# Patient Record
Sex: Female | Born: 1990 | Race: Black or African American | Hispanic: No | Marital: Single | Smoking: Current every day smoker
Health system: Southern US, Community
[De-identification: ages and names within clinical notes are randomized; demographics above are authoritative.]

## PROBLEM LIST (undated history)

## (undated) DIAGNOSIS — E079 Disorder of thyroid, unspecified: Secondary | ICD-10-CM

## (undated) DIAGNOSIS — Z789 Other specified health status: Secondary | ICD-10-CM

## (undated) HISTORY — PX: NO PAST SURGERIES: SHX2092

---

## 2017-06-25 ENCOUNTER — Encounter (HOSPITAL_COMMUNITY): Payer: Self-pay | Admitting: Emergency Medicine

## 2017-06-25 DIAGNOSIS — F17290 Nicotine dependence, other tobacco product, uncomplicated: Secondary | ICD-10-CM | POA: Diagnosis not present

## 2017-06-25 DIAGNOSIS — K2970 Gastritis, unspecified, without bleeding: Secondary | ICD-10-CM | POA: Insufficient documentation

## 2017-06-25 DIAGNOSIS — N760 Acute vaginitis: Secondary | ICD-10-CM | POA: Diagnosis not present

## 2017-06-25 DIAGNOSIS — B9689 Other specified bacterial agents as the cause of diseases classified elsewhere: Secondary | ICD-10-CM | POA: Diagnosis not present

## 2017-06-25 DIAGNOSIS — Z113 Encounter for screening for infections with a predominantly sexual mode of transmission: Secondary | ICD-10-CM | POA: Insufficient documentation

## 2017-06-25 DIAGNOSIS — R197 Diarrhea, unspecified: Secondary | ICD-10-CM | POA: Insufficient documentation

## 2017-06-25 DIAGNOSIS — R110 Nausea: Secondary | ICD-10-CM | POA: Insufficient documentation

## 2017-06-25 DIAGNOSIS — R102 Pelvic and perineal pain: Secondary | ICD-10-CM | POA: Diagnosis present

## 2017-06-25 LAB — URINALYSIS, ROUTINE W REFLEX MICROSCOPIC
Bilirubin Urine: NEGATIVE
Glucose, UA: NEGATIVE mg/dL
Hgb urine dipstick: NEGATIVE
Ketones, ur: 5 mg/dL — AB
Nitrite: NEGATIVE
Protein, ur: NEGATIVE mg/dL
Specific Gravity, Urine: 1.028 (ref 1.005–1.030)
pH: 6 (ref 5.0–8.0)

## 2017-06-25 LAB — CBC
HCT: 37 % (ref 36.0–46.0)
Hemoglobin: 12.2 g/dL (ref 12.0–15.0)
MCH: 30.4 pg (ref 26.0–34.0)
MCHC: 33 g/dL (ref 30.0–36.0)
MCV: 92.3 fL (ref 78.0–100.0)
Platelets: 198 10*3/uL (ref 150–400)
RBC: 4.01 MIL/uL (ref 3.87–5.11)
RDW: 12.6 % (ref 11.5–15.5)
WBC: 8.6 10*3/uL (ref 4.0–10.5)

## 2017-06-25 LAB — COMPREHENSIVE METABOLIC PANEL
ALT: 16 U/L (ref 14–54)
AST: 21 U/L (ref 15–41)
Albumin: 3.7 g/dL (ref 3.5–5.0)
Alkaline Phosphatase: 43 U/L (ref 38–126)
Anion gap: 6 (ref 5–15)
BUN: 6 mg/dL (ref 6–20)
CO2: 26 mmol/L (ref 22–32)
Calcium: 8.7 mg/dL — ABNORMAL LOW (ref 8.9–10.3)
Chloride: 110 mmol/L (ref 101–111)
Creatinine, Ser: 0.68 mg/dL (ref 0.44–1.00)
GFR calc Af Amer: >60 mL/min (ref >60)
GFR calc non Af Amer: >60 mL/min (ref >60)
Glucose, Bld: 71 mg/dL (ref 65–99)
Potassium: 3.6 mmol/L (ref 3.5–5.1)
Sodium: 142 mmol/L (ref 135–145)
Total Bilirubin: 1 mg/dL (ref 0.3–1.2)
Total Protein: 6.1 g/dL — ABNORMAL LOW (ref 6.5–8.1)

## 2017-06-25 LAB — I-STAT BETA HCG BLOOD, ED (MC, WL, AP ONLY): I-stat hCG, quantitative: <5 m[IU]/mL (ref <5)

## 2017-06-25 LAB — LIPASE, BLOOD: Lipase: 22 U/L (ref 11–51)

## 2017-06-25 NOTE — ED Triage Notes (Signed)
States I think I have the stomach bug I have had diarrhea for about a week.  Denies any n/v.  Also requests STD check.  Is also reporting bil flank pain and abdominal aching.

## 2017-06-26 ENCOUNTER — Emergency Department (HOSPITAL_COMMUNITY)
Admission: EM | Admit: 2017-06-26 | Discharge: 2017-06-26 | Disposition: A | Discharge status: Home / Self Care | Payer: Medicaid Other | Attending: Physician Assistant | Admitting: Physician Assistant

## 2017-06-26 DIAGNOSIS — B9689 Other specified bacterial agents as the cause of diseases classified elsewhere: Secondary | ICD-10-CM

## 2017-06-26 DIAGNOSIS — N760 Acute vaginitis: Secondary | ICD-10-CM

## 2017-06-26 DIAGNOSIS — K2970 Gastritis, unspecified, without bleeding: Secondary | ICD-10-CM

## 2017-06-26 HISTORY — DX: Disorder of thyroid, unspecified: E07.9

## 2017-06-26 LAB — RPR: RPR Ser Ql: NONREACTIVE

## 2017-06-26 LAB — WET PREP, GENITAL
Sperm: NONE SEEN
Trich, Wet Prep: NONE SEEN
Yeast Wet Prep HPF POC: NONE SEEN

## 2017-06-26 LAB — HIV ANTIBODY (ROUTINE TESTING W REFLEX): HIV Screen 4th Generation wRfx: NONREACTIVE

## 2017-06-26 MED ORDER — METRONIDAZOLE 500 MG PO TABS
500.0000 mg | ORAL_TABLET | Freq: Once | ORAL | Status: AC
  Administered 2017-06-26: 500 mg via ORAL
  Filled 2017-06-26: qty 1

## 2017-06-26 MED ORDER — METRONIDAZOLE 500 MG PO TABS
500.0000 mg | ORAL_TABLET | Freq: Two times a day (BID) | ORAL | 0 refills | Status: DC
Start: 2017-06-26 — End: 2019-11-22

## 2017-06-26 MED ORDER — FLUCONAZOLE 200 MG PO TABS: 200.0000 mg | ORAL_TABLET | Freq: Every day | ORAL | 0 refills | Status: AC

## 2017-06-26 MED ORDER — OMEPRAZOLE 20 MG PO CPDR: 20.0000 mg | DELAYED_RELEASE_CAPSULE | Freq: Every day | ORAL | 0 refills | Status: DC

## 2017-06-26 NOTE — Discharge Instructions (Signed)
We think that your discharge is likely from bacterial vaginosis.  We have given you the antibiotic to help with this.  For your chronic abdominal pain, consider taking omeprazole which may help with the inflammation in your stomach wall.  Please follow-up with primary care physician.   To find a primary care or specialty doctor please call [phone] or [phone] to access "[place] Find a Doctor Service."  You may also go on the [facility] website at [email or web address]  There are also multiple [facility], [facility] and [facility] practices throughout the [place] that are frequently accepting new patients. You may find a clinic that is close to your home and contact them.  [facility] -  [address] [place] [id number] [phone]  [facility] in [place] (also locations in [place] and [place]) -  [address] [place] [address] [phone]  [facility] -  [address] [place] [address] [phone]

## 2017-06-26 NOTE — ED Notes (Signed)
Pt refused vitals in the waiting room. Pt states "Im fine, Im fine! I don't need that".

## 2017-06-26 NOTE — ED Provider Notes (Signed)
[facility] EMERGENCY [facility]PART[facility] Provider Note   CSN: [id number] Arrival date & time: 06/25/17  1901     History   Chief Complaint Chief Complaint  Patient presents with  . std check  . Diarrhea    HPI Kathlen BrunswickMarkeedah Yong is a 26 y.o. female.  HPI   Patient is a 26 year old female presenting with concern for STD.Marland Kitchen.  Patient reports malodorous thin yellow liquid.  She reports chronic abdominal pain that has been going on for a number of years.  Patient takes Megace which she buys on the street.  She reports she "has a thyroid condition".  She reports she is asked her doctor to prescribe Megace in the past but they refused.  She also reports that the doctor no longer wants to be on thyroid medication.  Patient says that she sometimes has loose stools.  She has chronic cramping occasional epigastric pain.  No right upper quadrant no right lower quadrant pain.  Patient reports no vomiting, just nausea and then diarrhea cramping.  Past Medical History:  Diagnosis Date  . Thyroid disease     There are no active problems to display for this patient.   History reviewed. No pertinent surgical history.  OB History    No data available       Home Medications    Prior to Admission medications   Not on File    Family History No family history on file.  Social History Social History   Tobacco Use  . Smoking status: Current Every Day Smoker    Types: Cigars  . Smokeless tobacco: Never Used  Substance Use Topics  . Alcohol use: No    Frequency: Never  . Drug use: No     Allergies   Patient has no known allergies.   Review of Systems Review of Systems  Constitutional: Negative for activity change.  Respiratory: Negative for shortness of breath.   Cardiovascular: Negative for chest pain.  Gastrointestinal: Positive for diarrhea. Negative for abdominal pain.  Genitourinary: Positive for vaginal discharge and vaginal pain.  All other systems reviewed  and are negative.    Physical Exam Updated Vital Signs BP 108/74 (BP Location: Right Arm)   Pulse 74   Temp 98 F (36.7 C) (Oral)   Resp 16   Ht 5\' 4"  (1.626 m)   Wt 69.2 kg (152 lb 8 oz)   LMP 05/18/2017   SpO2 100%   BMI 26.18 kg/m   Physical Exam  Constitutional: She is oriented to person, place, and time. She appears well-developed and well-nourished.  HENT:  Head: Normocephalic and atraumatic.  Eyes: Right eye exhibits no discharge.  Cardiovascular: Normal rate, regular rhythm and normal heart sounds.  No murmur heard. Pulmonary/Chest: Effort normal and breath sounds normal. She has no wheezes. She has no rales.  Abdominal: Soft. She exhibits no distension. There is no tenderness.  Genitourinary: Vaginal discharge found.  Genitourinary Comments: Malodorous thin discharge.  No CMT.  Neurological: She is oriented to person, place, and time.  Skin: Skin is warm and dry. She is not diaphoretic.  Psychiatric: She has a normal mood and affect.  Nursing note and vitals reviewed.    ED Treatments / Results  Labs (all labs ordered are listed, but only abnormal results are displayed) Labs Reviewed  COMPREHENSIVE METABOLIC PANEL - Abnormal; Notable for the following components:      Result Value   Calcium 8.7 (*)    Total Protein 6.1 (*)  All other components within normal limits  URINALYSIS, ROUTINE W REFLEX MICROSCOPIC - Abnormal; Notable for the following components:   APPearance HAZY (*)    Ketones, ur 5 (*)    Leukocytes, UA SMALL (*)    Bacteria, UA RARE (*)    Squamous Epithelial / LPF 6-30 (*)    All other components within normal limits  WET PREP, GENITAL  LIPASE, BLOOD  CBC  RPR  HIV ANTIBODY (ROUTINE TESTING)  I-STAT BETA HCG BLOOD, ED ([facility], [facility], [facility] ONLY)  GC/CHLAMYDIA PROBE AMP ([place]) NOT AT [facility]    EKG  EKG Interpretation None       Radiology No results found.  Procedures Procedures (including critical care time)  Medications  Ordered in ED Medications - No data to display   Initial Impression / Assessment and Plan / ED Course  I have reviewed the triage vital signs and the nursing notes.  Pertinent labs & imaging results that were available during my care of the patient were reviewed by me and considered in my medical decision making (see chart for details).    Patient is a 26 year old female presenting with concern for STD.Marland Kitchen.  Patient reports malodorous thin yellow liquid.  She reports chronic abdominal pain that has been going on for a number of years.  Patient takes Megace which she buys on the street.  She reports she "has a thyroid condition".  She reports she is asked her doctor to prescribe Megace in the past but they refused.  She also reports that the doctor no longer wants to be on thyroid medication.  Patient says that she sometimes has loose stools.  She has chronic cramping occasional epigastric pain.  No right upper quadrant no right lower quadrant pain.  Patient reports no vomiting, just nausea and then diarrhea cramping.  5:53 AM Patient is very well-appearing.  Patient has been taking this over-the-counter Megace and recently stopped 3 months ago.  Encouraged her not to take medications that she finds on the street.  Rather follow-up with her primary care physician.  As for her chronic diarrhea, will have her follow-up with her primary care physician.  Patient has normal vitals and reassuring physical exam.  Final Clinical Impressions(s) / ED Diagnoses   Final diagnoses:  None    ED Discharge Orders    None       Abelino DerrickMackuen, Nizhoni Parlow Lyn, MD 06/30/17 [id number]

## 2017-06-26 NOTE — ED Notes (Signed)
ED Provider at bedside. 

## 2017-06-29 LAB — GC/CHLAMYDIA PROBE AMP (~~LOC~~) NOT AT ARMC
Chlamydia: NEGATIVE
Neisseria Gonorrhea: NEGATIVE

## 2019-11-22 ENCOUNTER — Other Ambulatory Visit: Payer: Self-pay

## 2019-11-22 ENCOUNTER — Encounter (HOSPITAL_COMMUNITY): Payer: Self-pay | Admitting: *Deleted

## 2019-11-22 ENCOUNTER — Encounter: Payer: Self-pay | Admitting: Obstetrics

## 2019-11-22 ENCOUNTER — Ambulatory Visit (INDEPENDENT_AMBULATORY_CARE_PROVIDER_SITE_OTHER): Payer: Medicaid Other | Admitting: *Deleted

## 2019-11-22 ENCOUNTER — Inpatient Hospital Stay (HOSPITAL_COMMUNITY)
Admission: EM | Admit: 2019-11-22 | Discharge: 2019-11-23 | Disposition: A | Discharge status: Home / Self Care | Payer: Medicaid Other | Attending: Obstetrics and Gynecology | Admitting: Obstetrics and Gynecology

## 2019-11-22 DIAGNOSIS — O99331 Smoking (tobacco) complicating pregnancy, first trimester: Secondary | ICD-10-CM | POA: Diagnosis not present

## 2019-11-22 DIAGNOSIS — Z3201 Encounter for pregnancy test, result positive: Secondary | ICD-10-CM

## 2019-11-22 DIAGNOSIS — Z3A01 Less than 8 weeks gestation of pregnancy: Secondary | ICD-10-CM | POA: Diagnosis not present

## 2019-11-22 DIAGNOSIS — F17290 Nicotine dependence, other tobacco product, uncomplicated: Secondary | ICD-10-CM | POA: Diagnosis not present

## 2019-11-22 DIAGNOSIS — O209 Hemorrhage in early pregnancy, unspecified: Secondary | ICD-10-CM | POA: Diagnosis not present

## 2019-11-22 DIAGNOSIS — R110 Nausea: Secondary | ICD-10-CM | POA: Diagnosis not present

## 2019-11-22 DIAGNOSIS — O3680X0 Pregnancy with inconclusive fetal viability, not applicable or unspecified: Secondary | ICD-10-CM | POA: Diagnosis not present

## 2019-11-22 DIAGNOSIS — Z3200 Encounter for pregnancy test, result unknown: Secondary | ICD-10-CM | POA: Diagnosis not present

## 2019-11-22 DIAGNOSIS — O26851 Spotting complicating pregnancy, first trimester: Secondary | ICD-10-CM | POA: Diagnosis present

## 2019-11-22 HISTORY — DX: Other specified health status: Z78.9

## 2019-11-22 LAB — POCT URINE PREGNANCY: Preg Test, Ur: POSITIVE — AB

## 2019-11-22 LAB — I-STAT BETA HCG BLOOD, ED (MC, WL, AP ONLY): I-stat hCG, quantitative: 492 m[IU]/mL — ABNORMAL HIGH (ref <5)

## 2019-11-22 MED ORDER — SODIUM CHLORIDE 0.9% FLUSH: 3.0000 mL | Freq: Once | INTRAVENOUS | Status: DC

## 2019-11-22 NOTE — Progress Notes (Signed)
Kelly Harmon presents today for UPT. She has no unusual complaints.  LMP:10/14/19 EDD: 07/20/20    OBJECTIVE: Appears well, in no apparent distress.  OB History   No obstetric history on file.    Home UPT Result:Positive In-Office UPT result:Positive  I have reviewed the patient's medical, obstetrical, social, and family histories, and medications.   ASSESSMENT: Positive pregnancy test  PLAN Prenatal care to be completed at:  [facility]

## 2019-11-22 NOTE — ED Notes (Signed)
Pt did not respond to being called x3

## 2019-11-22 NOTE — Progress Notes (Signed)
Patient seen and assessed by nursing staff during this encounter. I have reviewed the chart and agree with the documentation and plan. I have also made any necessary editorial changes.  Catalina Antigua, MD 11/22/2019 4:14 PM

## 2019-11-22 NOTE — ED Triage Notes (Signed)
To ED for eval of vomiting for the past few days. States she feels like she may be pregnant. Complains of breast tenderness also. LMP 3/20

## 2019-11-22 NOTE — ED Notes (Signed)
Patient back from getting food. Vitals updated.

## 2019-11-23 ENCOUNTER — Encounter (HOSPITAL_COMMUNITY): Payer: Self-pay | Admitting: *Deleted

## 2019-11-23 ENCOUNTER — Inpatient Hospital Stay (HOSPITAL_COMMUNITY): Payer: Medicaid Other

## 2019-11-23 DIAGNOSIS — R110 Nausea: Secondary | ICD-10-CM

## 2019-11-23 DIAGNOSIS — Z3A01 Less than 8 weeks gestation of pregnancy: Secondary | ICD-10-CM

## 2019-11-23 DIAGNOSIS — O3680X0 Pregnancy with inconclusive fetal viability, not applicable or unspecified: Secondary | ICD-10-CM

## 2019-11-23 DIAGNOSIS — O209 Hemorrhage in early pregnancy, unspecified: Secondary | ICD-10-CM

## 2019-11-23 LAB — WET PREP, GENITAL
Sperm: NONE SEEN
Trich, Wet Prep: NONE SEEN
Yeast Wet Prep HPF POC: NONE SEEN

## 2019-11-23 LAB — URINALYSIS, ROUTINE W REFLEX MICROSCOPIC
Bilirubin Urine: NEGATIVE
Glucose, UA: NEGATIVE mg/dL
Hgb urine dipstick: NEGATIVE
Ketones, ur: NEGATIVE mg/dL
Nitrite: NEGATIVE
Protein, ur: NEGATIVE mg/dL
Specific Gravity, Urine: 1.028 (ref 1.005–1.030)
pH: 6 (ref 5.0–8.0)

## 2019-11-23 LAB — ABO/RH: ABO/RH(D): A POS

## 2019-11-23 MED ORDER — DOXYLAMINE-PYRIDOXINE 10-10 MG PO TBEC: 2.0000 | DELAYED_RELEASE_TABLET | Freq: Every evening | ORAL | 2 refills | Status: AC | PRN

## 2019-11-23 NOTE — Discharge Instructions (Signed)
Morning Sickness  Morning sickness is when a woman feels nauseous during pregnancy. This nauseous feeling may or may not come with vomiting. It often occurs in the morning, but it can be a problem at any time of day. Morning sickness is most common during the first trimester. In some cases, it may continue throughout pregnancy. Although morning sickness is unpleasant, it is usually harmless unless the woman develops severe and continual vomiting (hyperemesis gravidarum), a condition that requires more intense treatment. What are the causes? The exact cause of this condition is not known, but it seems to be related to normal hormonal changes that occur in pregnancy. What increases the risk? You are more likely to develop this condition if:  You experienced nausea or vomiting before your pregnancy.  You had morning sickness during a previous pregnancy.  You are pregnant with more than one baby, such as twins. What are the signs or symptoms? Symptoms of this condition include:  Nausea.  Vomiting. How is this diagnosed? This condition is usually diagnosed based on your signs and symptoms. How is this treated? In many cases, treatment is not needed for this condition. Making some changes to what you eat may help to control symptoms. Your health care provider may also prescribe or recommend:  Vitamin B6 supplements.  Anti-nausea medicines.  Ginger. Follow these instructions at home: Medicines  Take over-the-counter and prescription medicines only as told by your health care provider. Do not use any prescription, over-the-counter, or herbal medicines for morning sickness without first talking with your health care provider.  Taking multivitamins before getting pregnant can prevent or decrease the severity of morning sickness in most women. Eating and drinking  Eat a piece of dry toast or crackers before getting out of bed in the morning.  Eat 5 or 6 small meals a day.  Eat dry and  bland foods, such as rice or a baked potato. Foods that are high in carbohydrates are often helpful.  Avoid greasy, fatty, and spicy foods.  Have someone cook for you if the smell of any food causes nausea and vomiting.  If you feel nauseous after taking prenatal vitamins, take the vitamins at night or with a snack.  Snack on protein foods between meals if you are hungry. Nuts, yogurt, and cheese are good options.  Drink fluids throughout the day.  Try ginger ale made with real ginger, ginger tea made from fresh grated ginger, or ginger candies. General instructions  Do not use any products that contain nicotine or tobacco, such as cigarettes and e-cigarettes. If you need help quitting, ask your health care provider.  Get an air purifier to keep the air in your house free of odors.  Get plenty of fresh air.  Try to avoid odors that trigger your nausea.  Consider trying these methods to help relieve symptoms: ? Wearing an acupressure wristband. These wristbands are often worn for seasickness. ? Acupuncture. Contact a health care provider if:  Your home remedies are not working and you need medicine.  You feel dizzy or light-headed.  You are losing weight. Get help right away if:  You have persistent and uncontrolled nausea and vomiting.  You faint.  You have severe pain in your abdomen. Summary  Morning sickness is when a woman feels nauseous during pregnancy. This nauseous feeling may or may not come with vomiting.  Morning sickness is most common during the first trimester.  It often occurs in the morning, but it can be a problem at  any time of day. °· In many cases, treatment is not needed for this condition. Making some changes to what you eat may help to control symptoms. °This information is not intended to replace advice given to you by your health care provider. Make sure you discuss any questions you have with your health care provider. °Document Revised:  06/25/2017 Document Reviewed: 08/15/2016 °Elsevier Patient Education © 2020 Elsevier Inc. °Vaginal Bleeding During Pregnancy, First Trimester ° °A small amount of bleeding (spotting) from the vagina is common during early pregnancy. Sometimes the bleeding is normal and does not cause problems. At other times, though, bleeding may be a sign of something serious. Tell your doctor about any bleeding from your vagina right away. °Follow these instructions at home: °Activity °· Follow your doctor's instructions about how active you can be. °· If needed, make plans for someone to help with your normal activities. °· Do not have sex or orgasms until your doctor says that this is safe. °General instructions °· Take over-the-counter and prescription medicines only as told by your doctor. °· Watch your condition for any changes. °· Write down: °? The number of pads you use each day. °? How often you change pads. °? How soaked (saturated) your pads are. °· Do not use tampons. °· Do not douche. °· If you pass any tissue from your vagina, save it to show to your doctor. °· Keep all follow-up visits as told by your doctor. This is important. °Contact a doctor if: °· You have vaginal bleeding at any time while you are pregnant. °· You have cramps. °· You have a fever. °Get help right away if: °· You have very bad cramps in your back or belly (abdomen). °· You pass large clots or a lot of tissue from your vagina. °· Your bleeding gets worse. °· You feel light-headed. °· You feel weak. °· You pass out (faint). °· You have chills. °· You are leaking fluid from your vagina. °· You have a gush of fluid from your vagina. °Summary °· Sometimes vaginal bleeding during pregnancy is normal and does not cause problems. At other times, bleeding may be a sign of something serious. °· Tell your doctor about any bleeding from your vagina right away. °· Follow your doctor's instructions about how active you can be. You may need someone to help  you with your normal activities. °This information is not intended to replace advice given to you by your health care provider. Make sure you discuss any questions you have with your health care provider. °Document Revised: 11/01/2018 Document Reviewed: 10/14/2016 °Elsevier Patient Education © 2020 Elsevier Inc. ° °

## 2019-11-23 NOTE — MAU Note (Signed)
Having pink spotting for 2 days. Mild lower back pain. Presented to [facility] ED last night and found out she is pregnant. LMP 10/14/19.

## 2019-11-23 NOTE — MAU Provider Note (Signed)
Chief Complaint: Emesis   First Provider Initiated Contact with Patient 11/23/19 [phone]        SUBJECTIVE HPI: Kelly Harmon is a 29 y.o. G2P1001 at [redacted]w[redacted]d by LMP who presents to maternity admissions reporting pink spotting for 2 days.  Also has some low back pain.  Just confirmed pregnancy tonight. .Has had some nausea but none now.  Comes and goes She denies vaginal itching/burning, urinary symptoms, h/a, dizziness, or fever/chills.    Emesis  This is a recurrent problem. The current episode started in the past 7 days. The problem occurs less than 2 times per day. The problem has been resolved. There has been no fever. Pertinent negatives include no abdominal pain, chills, diarrhea, dizziness, fever or myalgias. She has tried nothing for the symptoms.  Vaginal Bleeding The patient's primary symptoms include vaginal bleeding. The patient's pertinent negatives include no genital itching, genital lesions, genital odor or pelvic pain. This is a new problem. The current episode started yesterday. The problem occurs intermittently. The pain is mild. Associated symptoms include back pain and vomiting. Pertinent negatives include no abdominal pain, chills, diarrhea or fever. The vaginal discharge was bloody. The vaginal bleeding is spotting. She has not been passing clots. She has not been passing tissue. Nothing aggravates the symptoms. She has tried nothing for the symptoms.   RN Note: Having pink spotting for 2 days. Mild lower back pain. Presented to [facility] ED last night and found out she is pregnant. LMP 10/14/19.   Past Medical History:  Diagnosis Date  . Medical history non-contributory   . Thyroid disease    Past Surgical History:  Procedure Laterality Date  . NO PAST SURGERIES     Social History   Socioeconomic History  . Marital status: Single    Spouse name: Not on file  . Number of children: Not on file  . Years of education: Not on file  . Highest education level: Not on file   Occupational History  . Not on file  Tobacco Use  . Smoking status: Current Every Day Smoker    Types: Cigars  . Smokeless tobacco: Never Used  Substance and Sexual Activity  . Alcohol use: No  . Drug use: No  . Sexual activity: Not on file  Other Topics Concern  . Not on file  Social History Narrative  . Not on file   Social Determinants of Health   Financial Resource Strain:   . Difficulty of Paying Living Expenses:   Food Insecurity:   . Worried About Charity fundraiser in the Last Year:   . Arboriculturist in the Last Year:   Transportation Needs:   . Film/video editor (Medical):   Marland Kitchen Lack of Transportation (Non-Medical):   Physical Activity:   . Days of Exercise per Week:   . Minutes of Exercise per Session:   Stress:   . Feeling of Stress :   Social Connections:   . Frequency of Communication with Friends and Family:   . Frequency of Social Gatherings with Friends and Family:   . Attends Religious Services:   . Active Member of Clubs or Organizations:   . Attends Archivist Meetings:   Marland Kitchen Marital Status:   Intimate Partner Violence:   . Fear of Current or Ex-Partner:   . Emotionally Abused:   Marland Kitchen Physically Abused:   . Sexually Abused:    No current facility-administered medications on file prior to encounter.   Current Outpatient Medications on  File Prior to Encounter  Medication Sig Dispense Refill  . omeprazole (PRILOSEC) 20 MG capsule Take 1 capsule (20 mg total) by mouth daily. (Patient not taking: Reported on 11/22/2019) 30 capsule 0   No Known Allergies  I have reviewed patient's Past Medical Hx, Surgical Hx, Family Hx, Social Hx, medications and allergies.   ROS:  Review of Systems  Constitutional: Negative for chills and fever.  Gastrointestinal: Positive for vomiting. Negative for abdominal pain and diarrhea.  Genitourinary: Positive for vaginal bleeding. Negative for pelvic pain.  Musculoskeletal: Positive for back pain. Negative  for myalgias.  Neurological: Negative for dizziness.   Review of Systems  Other systems negative   Physical Exam  Physical Exam Patient Vitals for the past 24 hrs:  BP Temp Temp src Pulse Resp SpO2 Height Weight  11/23/19 0432 109/72 -- -- 72 16 -- -- --  11/23/19 0414 113/65 -- -- 78 -- -- -- --  11/23/19 0413 -- 98.6 F (37 C) -- -- 16 -- 5\' 4"  (1.626 m) 87.5 kg  11/23/19 0306 112/69 98.4 F (36.9 C) Oral 73 16 100 % -- --  11/22/19 2239 120/65 98.4 F (36.9 C) Oral 83 16 98 % 5\' 4"  (1.626 m) 86.2 kg  11/22/19 1752 (!) 104/53 -- -- -- -- -- -- --  11/22/19 1750 (!) 104/48 -- -- -- -- -- -- --  11/22/19 1748 (!) 102/57 99.2 F (37.3 C) Oral 69 16 100 % 5\' 4"  (1.626 m) --   Constitutional: Well-developed, well-nourished female in no acute distress.  Cardiovascular: normal rate Respiratory: normal effort GI: Abd soft, non-tender. Pos BS x 4 MS: Extremities nontender, no edema, normal ROM Neurologic: Alert and oriented x 4.  GU: Neg CVAT.  PELVIC EXAM: Cervix pink, visually closed, without lesion, scant light red discharge, vaginal walls and external genitalia normal Bimanual exam: Cervix 0/long/high, firm, anterior, neg CMT, uterus nontender, nonenlarged, adnexa without tenderness, enlargement, or mass  LAB RESULTS Results for orders placed or performed during the hospital encounter of 11/22/19 (from the past 24 hour(s))  I-Stat beta hCG blood, ED     Status: Abnormal   Collection Time: 11/22/19  6:17 PM  Result Value Ref Range   I-stat hCG, quantitative 492.0 (H) <5 mIU/mL   Comment 3          Urinalysis, Routine w reflex microscopic     Status: Abnormal   Collection Time: 11/23/19  4:20 AM  Result Value Ref Range   Color, Urine YELLOW YELLOW   APPearance CLOUDY (A) CLEAR   Specific Gravity, Urine 1.028 1.005 - 1.030   pH 6.0 5.0 - 8.0   Glucose, UA NEGATIVE NEGATIVE mg/dL   Hgb urine dipstick NEGATIVE NEGATIVE   Bilirubin Urine NEGATIVE NEGATIVE   Ketones, ur  NEGATIVE NEGATIVE mg/dL   Protein, ur NEGATIVE NEGATIVE mg/dL   Nitrite NEGATIVE NEGATIVE   Leukocytes,Ua TRACE (A) NEGATIVE   RBC / HPF 0-5 0 - 5 RBC/hpf   WBC, UA 0-5 0 - 5 WBC/hpf   Bacteria, UA FEW (A) NONE SEEN   Squamous Epithelial / LPF 21-50 0 - 5   Mucus PRESENT   Wet prep, genital     Status: Abnormal   Collection Time: 11/23/19  4:33 AM   Specimen: Vaginal  Result Value Ref Range   Yeast Wet Prep HPF POC NONE SEEN NONE SEEN   Trich, Wet Prep NONE SEEN NONE SEEN   Clue Cells Wet Prep HPF POC PRESENT (A) NONE SEEN  WBC, Wet Prep HPF POC MANY (A) NONE SEEN   Sperm NONE SEEN   ABO/Rh     Status: None   Collection Time: 11/23/19  5:55 AM  Result Value Ref Range   ABO/RH(D)      A POS Performed at [facility], [address], [place], [place] [address]     IMAGING No results found.  MAU Management/MDM: Ordered usual first trimester r/o ectopic labs.   Pelvic exam and cultures done Will check baseline Ultrasound to rule out ectopic.  This bleeding/pain can represent a normal pregnancy with bleeding, spontaneous abortion or even an ectopic which can be life-threatening.  The process as listed above helps to determine which of these is present.  [place] did not show anything in uterus.  Reviewed findings with patient.  Recommend repeat HCG in 48 hrs then repeat US in 7-10 days if HCG rises.    ASSESSMENT 1. Vaginal bleeding in pregnancy, first trimester   2. Nausea   3. Pregnancy of unknown anatomic location   4. Pregnancy of unknown anatomic location   5. Vaginal bleeding in pregnancy, first trimester     PLAN Discharge home Plan to repeat HCG level in 48 hours  Will repeat  Ultrasound in about 7-10 days if HCG levels double appropriately  Ectopic precautions  Pt stable at time of discharge. Encouraged to return here or to other Urgent Care/ED if she develops worsening of symptoms, increase in pain, fever, or other concerning symptoms.    Wynelle Bourgeois  CNM, MSN Certified Nurse-Midwife 11/23/2019  4:33 AM

## 2019-11-23 NOTE — ED Provider Notes (Addendum)
[facility] EMERGENCY DEPARTMENT Provider Note   CSN: [id number] Arrival date & time: 11/22/19  1719     History Chief Complaint  Patient presents with  . Emesis    Kelly Harmon is a 29 y.o. female.  The history is provided by the patient and medical records.  Emesis   29 year old G2P1 with LMP 10/14/19, here with persistent nausea, vomiting, breast tenderness, and new vaginal spotting that began yesterday.  States bleeding is mild, no passage of clots.  Has had some associated lower abdominal cramping as well.  She has not had any fever or chills.  No urinary symptoms.  Thinks she may have noticed some clear/white vaginal discharge as well.  Unsure of STD status.  She has had several home pregnancy test that were positive as well as pregnancy test and women's clinic yesterday.  She has not yet established care with OB as she reports she was just released from jail recently.  Past Medical History:  Diagnosis Date  . Thyroid disease     There are no problems to display for this patient.   History reviewed. No pertinent surgical history.   OB History    Gravida  1   Para      Term      Preterm      AB      Living        SAB      TAB      Ectopic      Multiple      Live Births              No family history on file.  Social History   Tobacco Use  . Smoking status: Current Every Day Smoker    Types: Cigars  . Smokeless tobacco: Never Used  Substance Use Topics  . Alcohol use: No  . Drug use: No    Home Medications Prior to Admission medications   Medication Sig Start Date End Date Taking? Authorizing Provider  omeprazole (PRILOSEC) 20 MG capsule Take 1 capsule (20 mg total) by mouth daily. Patient not taking: Reported on 11/22/2019 06/26/17   Abelino Derrick, MD    Allergies    Patient has no known allergies.  Review of Systems   Review of Systems  Gastrointestinal: Positive for nausea and vomiting.    Genitourinary: Positive for vaginal bleeding.  All other systems reviewed and are negative.   Physical Exam Updated Vital Signs BP 112/69   Pulse 73   Temp 98.4 F (36.9 C) (Oral)   Resp 16   Ht 5\' 4"  (1.626 m)   Wt 86.2 kg   LMP 10/14/2019 (Exact Date)   SpO2 100%   BMI 32.61 kg/m   Physical Exam Vitals and nursing note reviewed.  Constitutional:      Appearance: She is well-developed.     Comments: No active emesis  HENT:     Head: Normocephalic and atraumatic.  Eyes:     Conjunctiva/sclera: Conjunctivae normal.     Pupils: Pupils are equal, round, and reactive to light.  Cardiovascular:     Rate and Rhythm: Normal rate and regular rhythm.     Heart sounds: Normal heart sounds.  Pulmonary:     Effort: Pulmonary effort is normal. No respiratory distress.     Breath sounds: Normal breath sounds. No rhonchi.  Abdominal:     General: Bowel sounds are normal.     Palpations: Abdomen is soft.  Tenderness: There is no abdominal tenderness. There is no rebound.  Musculoskeletal:        General: Normal range of motion.     Cervical back: Normal range of motion.  Skin:    General: Skin is warm and dry.  Neurological:     Mental Status: She is alert and oriented to person, place, and time.     ED Results / Procedures / Treatments   Labs (all labs ordered are listed, but only abnormal results are displayed) Labs Reviewed  I-STAT BETA HCG BLOOD, ED ([facility], [facility], [facility] ONLY) - Abnormal; Notable for the following components:      Result Value   I-stat hCG, quantitative 492.0 (*)    All other components within normal limits    EKG None  Radiology No results found.  Procedures Procedures (including critical care time)  Medications Ordered in ED Medications  sodium chloride flush (NS) 0.9 % injection 3 mL (has no administration in time range)    ED Course  I have reviewed the triage vital signs and the nursing notes.  Pertinent labs & imaging results that were  available during my care of the patient were reviewed by me and considered in my medical decision making (see chart for details).    MDM Rules/Calculators/A&P  29 year old G2P1 with LMP 10/14/19 here with persistent nausea, vomiting, breast tenderness for several days and new vaginal spotting that began yesterday.  She has had several positive home pregnancy tests as well as confirmatory test in [facility] yesterday.  She is afebrile, non-toxic, hemodynamically stable.  hcg here remains positive, likely very early pregnancy.  Has not yet established with OB, reports she was just released from jail. Will transfer to MAU for further evaluation and management.  Notified Dr. Charlette Caffey, accepts in transfer.  Final Clinical Impression(s) / ED Diagnoses Final diagnoses:  Vaginal bleeding in pregnancy, first trimester  Nausea    Rx / DC Orders ED Discharge Orders    None       Larene Pickett, PA-C 11/23/19 0331    Larene Pickett, PA-C 11/23/19 [id number]    Merrily Pew, MD 11/23/19 [phone]

## 2019-11-24 LAB — GC/CHLAMYDIA PROBE AMP (~~LOC~~) NOT AT ARMC
Chlamydia: NEGATIVE
Comment: NEGATIVE
Comment: NORMAL
Neisseria Gonorrhea: POSITIVE — AB

## 2019-11-25 ENCOUNTER — Other Ambulatory Visit: Payer: Self-pay

## 2019-11-25 ENCOUNTER — Inpatient Hospital Stay (HOSPITAL_COMMUNITY)
Admission: AD | Admit: 2019-11-25 | Discharge: 2019-11-26 | Disposition: A | Discharge status: Home / Self Care | Payer: Medicaid Other | Attending: Obstetrics and Gynecology | Admitting: Obstetrics and Gynecology

## 2019-11-25 ENCOUNTER — Telehealth: Payer: Self-pay | Admitting: Student

## 2019-11-25 DIAGNOSIS — O98219 Gonorrhea complicating pregnancy, unspecified trimester: Secondary | ICD-10-CM | POA: Insufficient documentation

## 2019-11-25 DIAGNOSIS — Z3490 Encounter for supervision of normal pregnancy, unspecified, unspecified trimester: Secondary | ICD-10-CM

## 2019-11-25 DIAGNOSIS — O98211 Gonorrhea complicating pregnancy, first trimester: Secondary | ICD-10-CM

## 2019-11-25 DIAGNOSIS — Z3A00 Weeks of gestation of pregnancy not specified: Secondary | ICD-10-CM | POA: Insufficient documentation

## 2019-11-25 LAB — HCG, QUANTITATIVE, PREGNANCY: hCG, Beta Chain, Quant, S: 2598 m[IU]/mL — ABNORMAL HIGH (ref <5)

## 2019-11-25 MED ORDER — CEFTRIAXONE SODIUM 500 MG IJ SOLR
500.0000 mg | Freq: Once | INTRAMUSCULAR | Status: AC
  Administered 2019-11-25: 500 mg via INTRAMUSCULAR
  Filled 2019-11-25: qty 500

## 2019-11-25 NOTE — MAU Provider Note (Signed)
None     S [name] is a 29 y.o. G2P1001 early pregnant female who presents to MAU today for repeat beta hCG.  Patient without complaints, but requests results from recent STD testing.  O BP (!) 141/63 (BP Location: Right Arm)   Temp 98.7 F (37.1 C) (Oral)   Resp 17   Wt 87.8 kg   LMP 10/14/2019 (Exact Date)   BMI 33.23 kg/m  Physical Exam  Constitutional: She is oriented to person, place, and time. She appears well-developed and well-nourished. No distress.  HENT:  Head: Normocephalic and atraumatic.  Eyes: Conjunctivae are normal.  Cardiovascular: Normal rate.  Respiratory: Effort normal.  Musculoskeletal:     Cervical back: Normal range of motion.  Neurological: She is alert and oriented to person, place, and time.  Psychiatric: She has a normal mood and affect. Her behavior is normal.   Results for orders placed or performed during the hospital encounter of 11/25/19 (from the past 24 hour(s))  hCG, quantitative, pregnancy     Status: Abnormal   Collection Time: 11/25/19  9:01 PM  Result Value Ref Range   hCG, Beta Chain, Quant, S 2,598 (H) <5 mIU/mL     A Early Pregnant Female Appropriate rise in hCG GC Infection  P -Informed that rise from 492 to 2598 is appropriate. -Labs reviewed and patient informed she has GC. -Will give treatment today based on new recommendations.  -Patient states she does not need information for partner treatment as they are no longer together. -Addressed concern regarding pregnancy.  Discussed need for TOC later in pregnancy -Will place order for repeat US in 1-2 weeks.  -Bleeding Precautions Given -Discharge from MAU in stable condition -Patient may return to MAU as needed for pregnancy related complaints  Gerrit Heck, CNM 11/25/2019 11:34 PM

## 2019-11-25 NOTE — MAU Note (Signed)
Pt reports to MAU for f/u lab work. No other complaints at this time.

## 2019-11-25 NOTE — Telephone Encounter (Signed)
Called patient to see if she was coming for follow up BHCG this evening; patient did not answer. Left VM stating I was calling from [facility] and wanted to reach her; left call back number for MAU.   Luna Kitchens

## 2019-11-26 NOTE — Discharge Instructions (Signed)
[facility] Prenatal Care Providers   Center for [facility] at [facility]       Phone: [phone]  Center for [facility] at [place] Phone: [phone]  Center for [facility] at [place]  Phone: [phone]  Center for [facility] at [facility]  Phone: [phone]  Center for [facility] at [place]  Phone: [phone]  [facility]       Phone: [phone]  [facility]    Phone: [phone]   [facility])    Phone: [phone]  [facility]    Phone: [phone]  [facility]    Phone: [phone]   [facility]  Phone: [phone]  [facility]    Phone: [phone]  [facility]   Phone: [phone]  [facility]    Phone: [phone]   Prenatal Care Prenatal care is health care during pregnancy. It helps you and your unborn baby (fetus) stay as healthy as possible. Prenatal care may be provided by a midwife, a family practice health care provider, or a childbirth and pregnancy specialist (obstetrician). How does this affect me? During pregnancy, you will be closely monitored for any new conditions that might develop. To lower your risk of pregnancy complications, you and your health care provider will talk about any underlying conditions you have. How does this affect my baby? Early and consistent prenatal care increases the chance that your baby will be healthy during pregnancy. Prenatal care lowers the risk that your baby will be:  Born early (prematurely).  Smaller than expected at birth (small for gestational age). What can I expect at the first prenatal care visit? Your first prenatal care visit will likely be the longest. You should schedule your first prenatal care visit as soon as you know that you are  pregnant. Your first visit is a good time to talk about any questions or concerns you have about pregnancy. At your visit, you and your health care provider will talk about:  Your medical history, including: ? Any past pregnancies. ? Your family's medical history. ? The baby's father's medical history. ? Any long-term (chronic) health conditions you have and how you manage them. ? Any surgeries or procedures you have had. ? Any current over-the-counter or prescription medicines, herbs, or supplements you are taking.  Other factors that could pose a risk to your baby, including:  Your home setting and your stress levels, including: ? Exposure to abuse or violence. ? Household financial strain. ? Mental health conditions you have.  Your daily health habits, including diet and exercise. Your health care provider will also:  Measure your weight, height, and blood pressure.  Do a physical exam, including a pelvic and breast exam.  Perform blood tests and urine tests to check for: ? Urinary tract infection. ? Sexually transmitted infections (STIs). ? Low iron levels in your blood (anemia). ? Blood type and certain proteins on red blood cells (Rh antibodies). ? Infections and immunity to viruses, such as hepatitis B and rubella. ? HIV (human immunodeficiency virus).  Do an ultrasound to confirm your baby's growth and development and to help predict your estimated due date (EDD). This ultrasound is done with a probe that is inserted into the vagina (transvaginal ultrasound).  Discuss your options for genetic screening.  Give you information about how to keep yourself and your baby healthy, including: ? Nutrition and taking vitamins. ? Physical activity. ?  How to manage pregnancy symptoms such as nausea and vomiting (morning sickness). ? Infections and substances that may be harmful to your baby and how to avoid them. ? Food safety. ? Dental care. ? Working. ? Travel. ? Warning  signs to watch for and when to call your health care provider. How often will I have prenatal care visits? After your first prenatal care visit, you will have regular visits throughout your pregnancy. The visit schedule is often as follows:  Up to week 28 of pregnancy: once every 4 weeks.  28-36 weeks: once every 2 weeks.  After 36 weeks: every week until delivery. Some women may have visits more or less often depending on any underlying health conditions and the health of the baby. Keep all follow-up and prenatal care visits as told by your health care provider. This is important. What happens during routine prenatal care visits? Your health care provider will:  Measure your weight and blood pressure.  Check for fetal heart sounds.  Measure the height of your uterus in your abdomen (fundal height). This may be measured starting around week 20 of pregnancy.  Check the position of your baby inside your uterus.  Ask questions about your diet, sleeping patterns, and whether you can feel the baby move.  Review warning signs to watch for and signs of labor.  Ask about any pregnancy symptoms you are having and how you are dealing with them. Symptoms may include: ? Headaches. ? Nausea and vomiting. ? Vaginal discharge. ? Swelling. ? Fatigue. ? Constipation. ? Any discomfort, including back or pelvic pain. Make a list of questions to ask your health care provider at your routine visits. What tests might I have during prenatal care visits? You may have blood, urine, and imaging tests throughout your pregnancy, such as:  Urine tests to check for glucose, protein, or signs of infection.  Glucose tests to check for a form of diabetes that can develop during pregnancy (gestational diabetes mellitus). This is usually done around week 24 of pregnancy.  An ultrasound to check your baby's growth and development and to check for birth defects. This is usually done around week 20 of  pregnancy.  A test to check for group B strep (GBS) infection. This is usually done around week 36 of pregnancy.  Genetic testing. This may include blood or imaging tests, such as an ultrasound. Some genetic tests are done during the first trimester and some are done during the second trimester. What else can I expect during prenatal care visits? Your health care provider may recommend getting certain vaccines during pregnancy. These may include:  A yearly flu shot (annual influenza vaccine). This is especially important if you will be pregnant during flu season.  Tdap (tetanus, diphtheria, pertussis) vaccine. Getting this vaccine during pregnancy can protect your baby from whooping cough (pertussis) after birth. This vaccine may be recommended between weeks 27 and 36 of pregnancy. Later in your pregnancy, your health care provider may give you information about:  Childbirth and breastfeeding classes.  Choosing a health care provider for your baby.  Umbilical cord banking.  Breastfeeding.  Birth control after your baby is born.  The hospital labor and delivery unit and how to tour it.  Registering at the hospital before you go into labor. Where to find more information  Office on Women's Health: LegalWarrants.gl  American Pregnancy Association: americanpregnancy.org  March of Dimes: marchofdimes.org Summary  Prenatal care helps you and your baby stay as healthy as possible during  pregnancy.  Your first prenatal care visit will most likely be the longest.  You will have visits and tests throughout your pregnancy to monitor your health and your baby's health.  Bring a list of questions to your visits to ask your health care provider.  Make sure to keep all follow-up and prenatal care visits with your health care provider. This information is not intended to replace advice given to you by your health care provider. Make sure you discuss any questions you have with your health  care provider. Document Revised: 11/02/2018 Document Reviewed: 07/12/2017 Elsevier Patient Education  2020 [employer].

## 2019-12-14 ENCOUNTER — Ambulatory Visit: Admission: RE | Admit: 2019-12-14 | Payer: Medicaid Other | Source: Ambulatory Visit

## 2019-12-27 DIAGNOSIS — Z3480 Encounter for supervision of other normal pregnancy, unspecified trimester: Secondary | ICD-10-CM | POA: Insufficient documentation

## 2019-12-28 ENCOUNTER — Encounter: Payer: Self-pay | Admitting: Family Medicine

## 2019-12-28 ENCOUNTER — Other Ambulatory Visit (HOSPITAL_COMMUNITY)
Admission: RE | Admit: 2019-12-28 | Discharge: 2019-12-28 | Disposition: A | Discharge status: Home / Self Care | Payer: Medicaid Other | Source: Ambulatory Visit | Attending: Family Medicine | Admitting: Family Medicine

## 2019-12-28 ENCOUNTER — Other Ambulatory Visit: Payer: Self-pay

## 2019-12-28 ENCOUNTER — Ambulatory Visit (INDEPENDENT_AMBULATORY_CARE_PROVIDER_SITE_OTHER): Payer: Medicaid Other | Admitting: Family Medicine

## 2019-12-28 VITALS — BP 121/75 | HR 96 | Wt 183.2 lb

## 2019-12-28 DIAGNOSIS — F1210 Cannabis abuse, uncomplicated: Secondary | ICD-10-CM | POA: Insufficient documentation

## 2019-12-28 DIAGNOSIS — O99321 Drug use complicating pregnancy, first trimester: Secondary | ICD-10-CM

## 2019-12-28 DIAGNOSIS — Z3480 Encounter for supervision of other normal pregnancy, unspecified trimester: Secondary | ICD-10-CM | POA: Diagnosis present

## 2019-12-28 DIAGNOSIS — O99331 Smoking (tobacco) complicating pregnancy, first trimester: Secondary | ICD-10-CM | POA: Diagnosis not present

## 2019-12-28 DIAGNOSIS — Z8619 Personal history of other infectious and parasitic diseases: Secondary | ICD-10-CM | POA: Diagnosis not present

## 2019-12-28 DIAGNOSIS — Z3A10 10 weeks gestation of pregnancy: Secondary | ICD-10-CM

## 2019-12-28 DIAGNOSIS — Z720 Tobacco use: Secondary | ICD-10-CM | POA: Insufficient documentation

## 2019-12-28 MED ORDER — VITAFOL ULTRA 29-0.6-0.4-200 MG PO CAPS: 30.0000 mg | ORAL_CAPSULE | Freq: Every day | ORAL | 12 refills | Status: AC

## 2019-12-28 MED ORDER — BLOOD PRESSURE KIT DEVI: 1.0000 | 0 refills | Status: AC

## 2019-12-28 NOTE — Addendum Note (Signed)
Addended by: Maretta Bees on: 12/28/2019 04:48 PM   Modules accepted: Orders

## 2019-12-28 NOTE — Progress Notes (Signed)
Patient presents for Initial OB visit. Patient states that she was treated for chlamydia last week. She has no other concerns.

## 2019-12-28 NOTE — Progress Notes (Signed)
Subjective:  Kelly Harmon is a 29 y.o. G2P1001 at 51w5dbeing seen today for ongoing prenatal care.  She is currently monitored for the following issues for this low-risk pregnancy and has Supervision of other normal pregnancy, antepartum; History of gonorrhea; History of chlamydia infection; Marijuana abuse; and Tobacco abuse on their problem list.  Patient reports no complaints.  Contractions: Irritability. Vag. Bleeding: None. Denies leaking of fluid.   The following portions of the patient's history were reviewed and updated as appropriate: allergies, current medications, past family history, past medical history, past social history, past surgical history and problem list. Problem list updated.  Objective:   Vitals:   12/28/19 1514  BP: 121/75  Pulse: 96  Weight: 183 lb 3.2 oz (83.1 kg)    Fetal Status: Fetal Heart Rate (bpm): 160         General:  Alert, oriented and cooperative. Patient is in no acute distress.  Skin: Skin is warm and dry. No rash noted.   Cardiovascular: Normal heart rate noted  Respiratory: Normal respiratory effort, no problems with respiration noted  Abdomen: Soft, gravid, appropriate for gestational age. Pain/Pressure: Absent     Pelvic: Vag. Bleeding: None     Cervical exam deferred        Extremities: Normal range of motion.  Edema: None  Mental Status: Normal mood and affect. Normal behavior. Normal judgment and thought content.    Assessment and Plan:  Pregnancy: G2P1001 at 175w5d1. Supervision of other normal pregnancy, antepartum - Oriented to practice - Continue routine prenatal care - Enroll Patient in Babyscripts - Babyscripts Schedule Optimization - Culture, OB Urine - Genetic Screening - CBC/D/Plt+RPR+Rh+ABO+Rub Ab... - Cytology - PAP( [place]) - Cervicovaginal ancillary only( [place]) - [place] OB COMP + 14 WK; Future - Blood Pressure Monitoring (BLOOD PRESSURE KIT) DEVI; 1 kit by Does not apply route once a week. Check  Blood Pressure regularly and record readings into the Babyscripts App.  Large Cuff.  DX O90.0  Dispense: 1 each; Refill: 0  2. History of gonorrhea - Treated last week  3. History of chlamydia infection - Treated prior to incarcerated  4. Marijuana abuse - Counseled on cessation  5. Tobacco abuse - Counseled on cessation  Preterm labor symptoms and general obstetric precautions including but not limited to vaginal bleeding, contractions, leaking of fluid and fetal movement were reviewed in detail with the patient. Please refer to After Visit Summary for other counseling recommendations.  Return in about 4 weeks (around 01/25/2020) for ROB, TOC gonorrhea/chlamydia, in person.   Makya Yurko L, DO

## 2019-12-28 NOTE — Patient Instructions (Signed)
Pregnancy and Smoking Smoking during pregnancy is unhealthy for you and your baby. Smoke from cigarettes, e-cigarettes, pipes, and cigars contains many chemicals that can cause cancer (carcinogens). These products also contain a stimulant drug (nicotine). When you smoke, harmful substances that you breathe in enter your bloodstream and can be passed on to your baby. This can affect your baby's development. If you are planning to become pregnant or have recently become pregnant, talk with your health care provider about quitting smoking. You have a much better chance of having a healthy pregnancy and a healthy baby if you do not smoke while you are pregnant. How does smoking affect me? Smoking increases your risk for many long-term (chronic) diseases. These diseases include cancer, lung diseases, and heart disease. Smoking during pregnancy increases your risk of:  Losing the pregnancy (miscarriage or stillbirth).  Giving birth too early (premature birth).  Pregnancy outside of the uterus (tubal pregnancy).  Problems with the placenta, which is the organ that provides the baby nourishment and oxygen. These problems may include: ? Attachment of the placenta over the opening of the uterus (placenta previa). ? Detachment of the placenta before the baby's birth (placental abruption).  Having your water break before labor begins. How does smoking affect my baby? Before birth Smoking during pregnancy:  Decreases blood flow and oxygen to your baby.  Increases your baby's risk of birth defects, such as heart defects.  Increases your baby's heart rate.  Slows your baby's growth in the uterus (intrauterine growth retardation). After birth Babies born to women who smoked during pregnancy may:  Have symptoms of nicotine withdrawal.  Be born with a cleft lip, cleft palate, or other facial deformities.  Be too small at birth.  Have a high risk of: ? Serious health problems or lifelong  disabilities. These may result in the long-term need for certain medicines, therapies, or other treatments. ? Sudden infant death syndrome (SIDS). Follow these instructions at home:   Do not use any products that contain nicotine or tobacco, such as cigarettes, e-cigarettes, and chewing tobacco. If you need help quitting, ask your health care provider.  Talk with your health care provider about support strategies to quit smoking. Some methods to consider include: ? Counseling (smoking cessation counseling). ? Psychotherapy. ? Acupuncture. ? Hypnosis. ? Telephone hotlines for people trying to quit.  Do not take nicotine supplements or medicine to help you quit smoking unless your health care provider tells you to do so.  Avoid secondhand smoke. Ask people who smoke to avoid smoking around you.  Identify people, places, things, and activities that make you want to smoke (triggers). Avoid them. Where to find more information Learn more about smoking during pregnancy and quitting smoking from:  March of Dimes: www.marchofdimes.org  U.S. Department of Health and Human Services: women.smokefree.gov  American Cancer Society: www.cancer.org  American Heart Association: www.heart.org  National Cancer Institute: www.cancer.gov For help to quit smoking:  National smoking cessation telephone hotline: 1-800-QUIT NOW (784-8669) Contact a health care provider if:  You are struggling to quit smoking.  You are a smoker and you become pregnant or plan to become pregnant.  You start smoking again after giving birth. Summary  Smoking during pregnancy is unhealthy for you and your baby.  Tobacco smoke contains harmful substances that can affect a baby's health and development.  Smoking increases the risk for serious problems, such as miscarriage, birth defects, or premature birth.  If you need help to quit smoking, talk to your health   care provider and ask about support strategies such as  counseling. This information is not intended to replace advice given to you by your health care provider. Make sure you discuss any questions you have with your health care provider. Document Revised: 02/10/2019 Document Reviewed: 02/10/2019 Elsevier Patient Education  2020 Elsevier Inc.  

## 2019-12-29 LAB — CBC/D/PLT+RPR+RH+ABO+RUB AB...
Antibody Screen: NEGATIVE
Basophils Absolute: 0 10*3/uL (ref 0.0–0.2)
Basos: 0 %
EOS (ABSOLUTE): 0 10*3/uL (ref 0.0–0.4)
Eos: 1 %
HCV Ab: <0.1 s/co ratio (ref 0.0–0.9)
HIV Screen 4th Generation wRfx: NONREACTIVE
Hematocrit: 38.4 % (ref 34.0–46.6)
Hemoglobin: 13 g/dL (ref 11.1–15.9)
Hepatitis B Surface Ag: NEGATIVE
Immature Grans (Abs): 0 10*3/uL (ref 0.0–0.1)
Immature Granulocytes: 0 %
Lymphocytes Absolute: 1.8 10*3/uL (ref 0.7–3.1)
Lymphs: 21 %
MCH: 31.3 pg (ref 26.6–33.0)
MCHC: 33.9 g/dL (ref 31.5–35.7)
MCV: 92 fL (ref 79–97)
Monocytes Absolute: 0.6 10*3/uL (ref 0.1–0.9)
Monocytes: 6 %
Neutrophils Absolute: 6.3 10*3/uL (ref 1.4–7.0)
Neutrophils: 72 %
Platelets: 181 10*3/uL (ref 150–450)
RBC: 4.16 x10E6/uL (ref 3.77–5.28)
RDW: 13.4 % (ref 11.7–15.4)
RPR Ser Ql: NONREACTIVE
Rh Factor: POSITIVE
Rubella Antibodies, IGG: 1.75 index (ref >0.99)
WBC: 8.8 10*3/uL (ref 3.4–10.8)

## 2019-12-29 LAB — HCV INTERPRETATION

## 2019-12-30 LAB — URINE CULTURE, OB REFLEX

## 2019-12-30 LAB — CULTURE, OB URINE

## 2020-01-02 LAB — CYTOLOGY - PAP
Comment: NEGATIVE
Diagnosis: NEGATIVE
High risk HPV: NEGATIVE

## 2020-01-04 ENCOUNTER — Ambulatory Visit: Admission: RE | Admit: 2020-01-04 | Payer: Medicaid Other | Source: Ambulatory Visit

## 2020-01-04 ENCOUNTER — Encounter: Payer: Self-pay | Admitting: Obstetrics and Gynecology

## 2020-01-08 ENCOUNTER — Encounter: Payer: Self-pay | Admitting: Obstetrics and Gynecology

## 2020-01-25 ENCOUNTER — Encounter: Payer: Medicaid Other | Admitting: Family Medicine

## 2020-07-20 ENCOUNTER — Inpatient Hospital Stay (HOSPITAL_COMMUNITY): Admission: AD | Admit: 2020-07-20 | Payer: Medicaid Other | Source: Home / Self Care

## 2021-05-16 IMAGING — US US OB < 14 WEEKS - US OB TV
1 series · 15 of 28 positions shown · non-contrast
Comparison: None.

CLINICAL DATA: Bleeding

EXAM:
OBSTETRIC <14 WK US AND TRANSVAGINAL OB US
TECHNIQUE: Both transabdominal and transvaginal ultrasound examinations were
performed for complete evaluation of the gestation as well as the
maternal uterus, adnexal regions, and pelvic cul-de-sac.
Transvaginal technique was performed to assess early pregnancy.

[Series 1: us ob < 14 weeks - us ob tv · 15 of 65 slices shown]
[im 1/65]
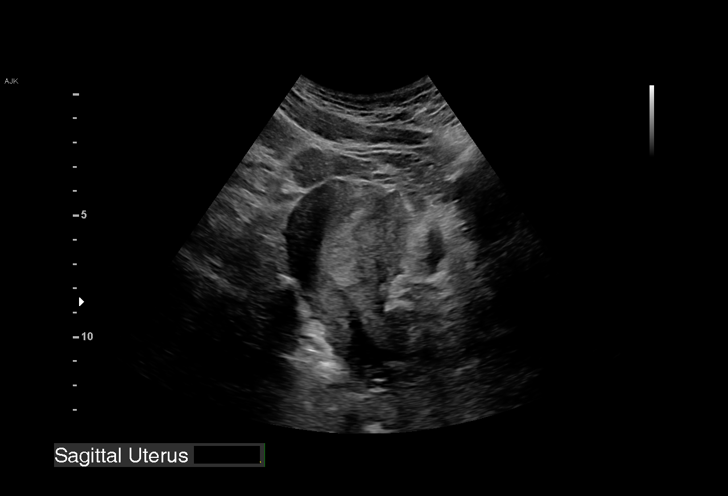
[im 5/65]
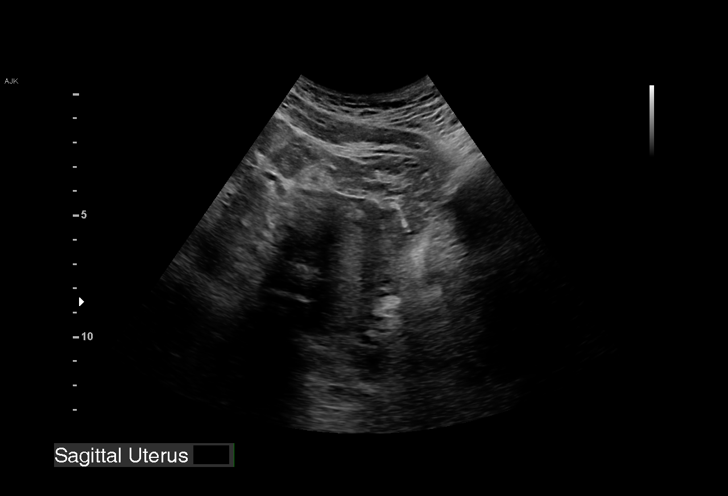
[im 10/65]
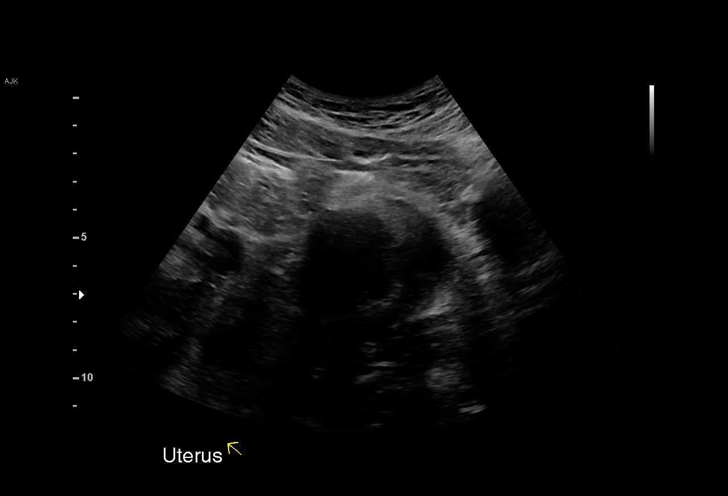
[im 15/65]
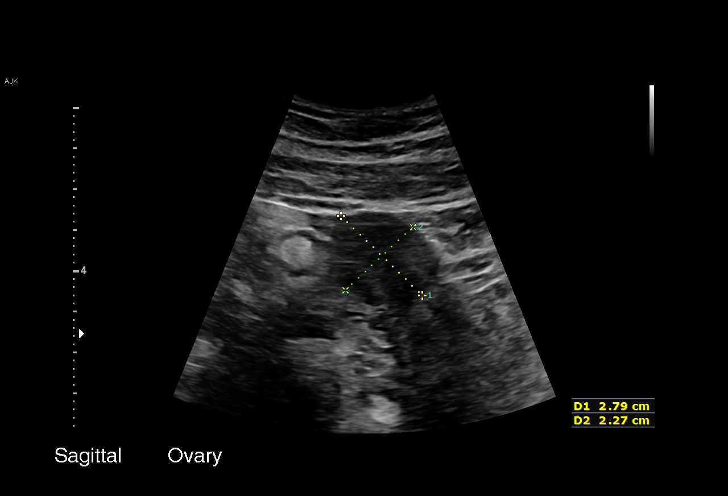
[im 19/65]
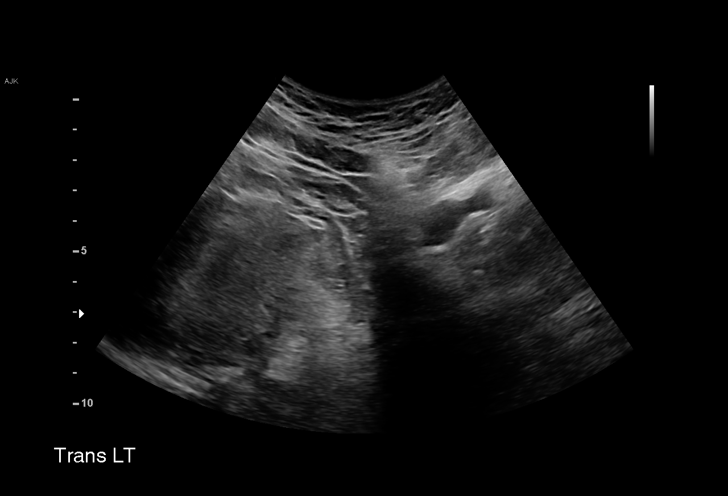
[im 24/65]
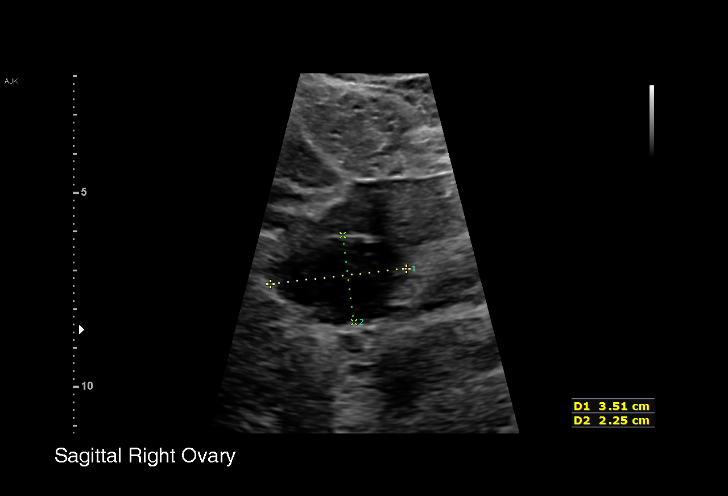
[im 29/65]
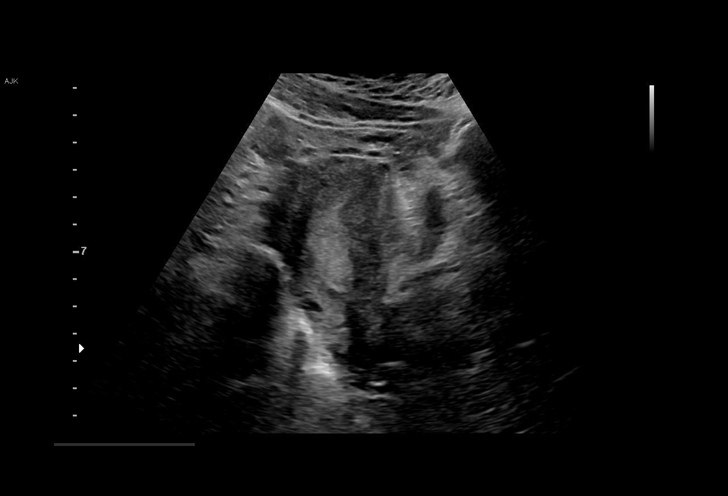
[im 34/65]
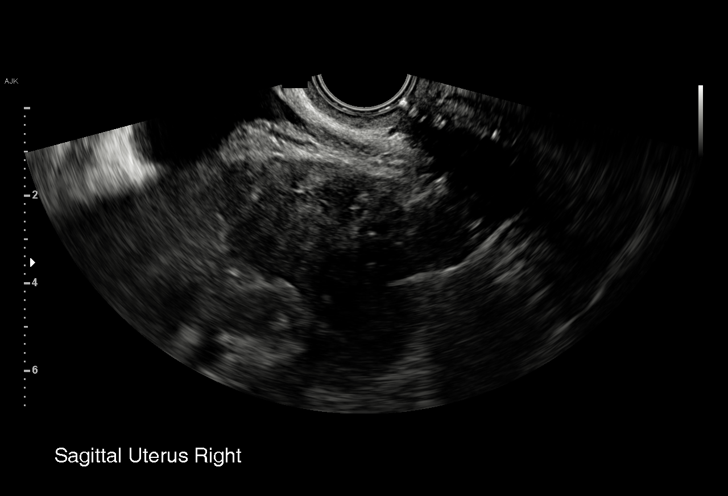
[im 36/65]
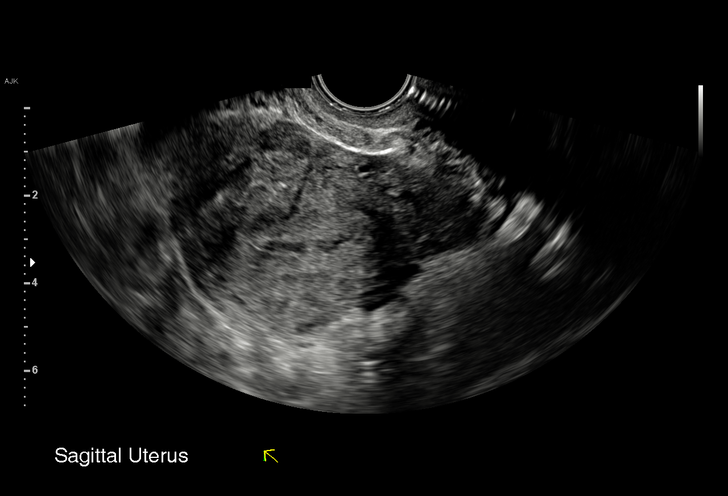
[im 41/65]
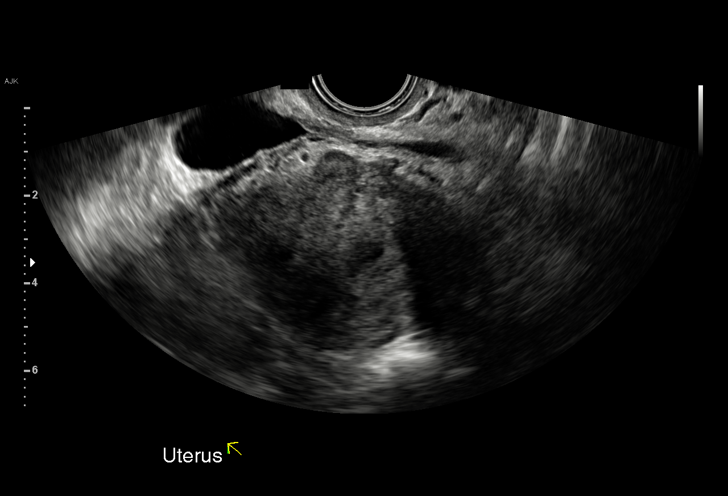
[im 46/65]
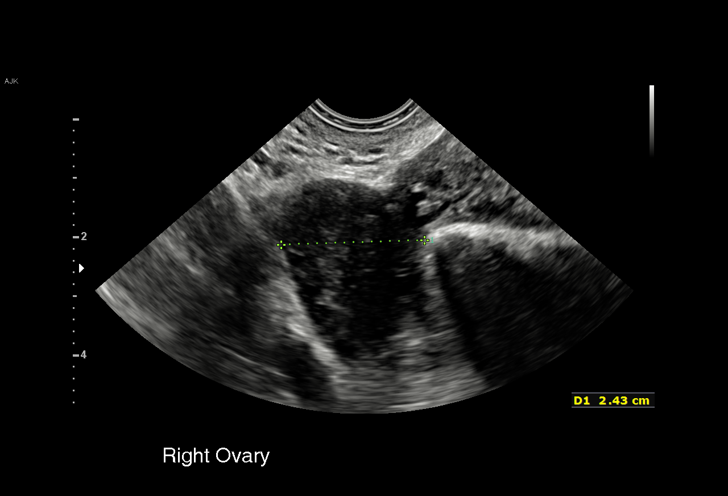
[im 50/65]
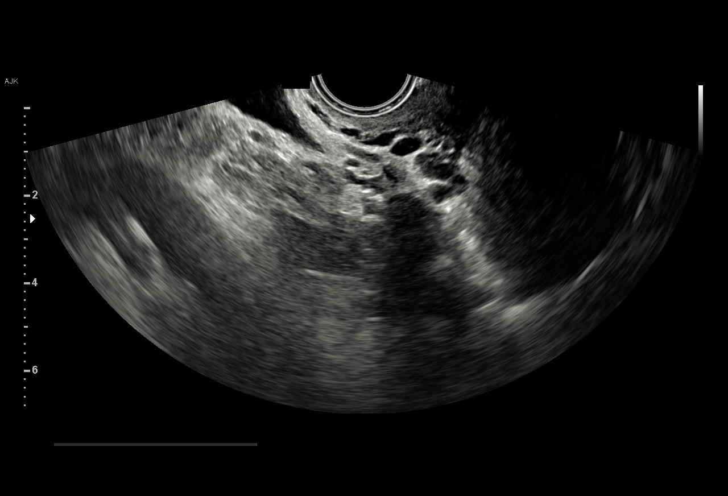
[im 55/65]
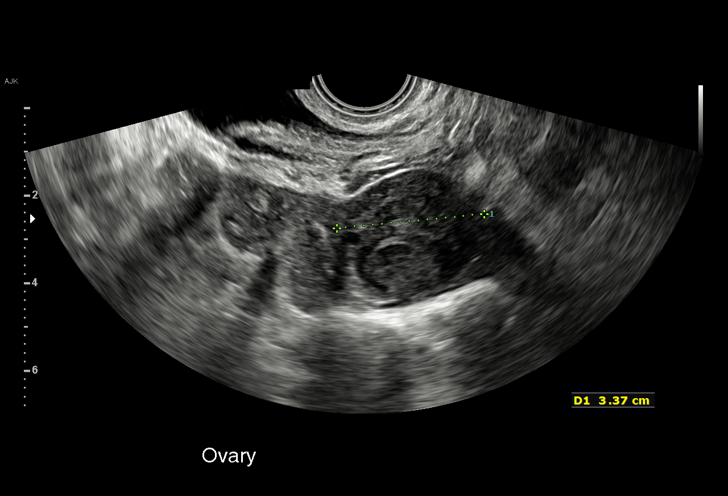
[im 60/65]
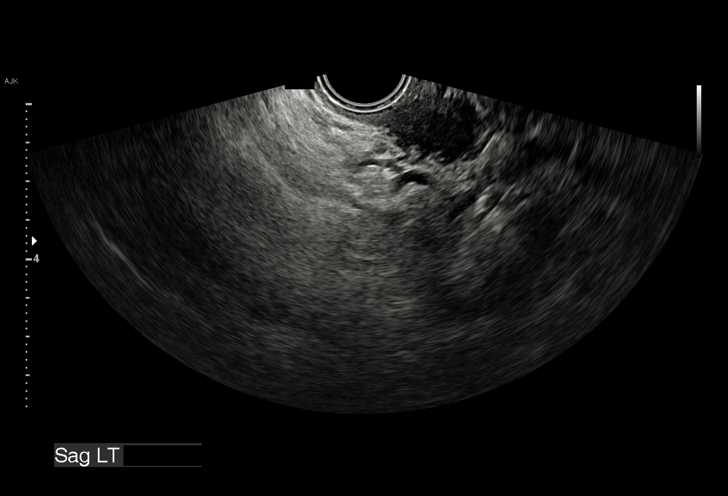
[im 65/65]
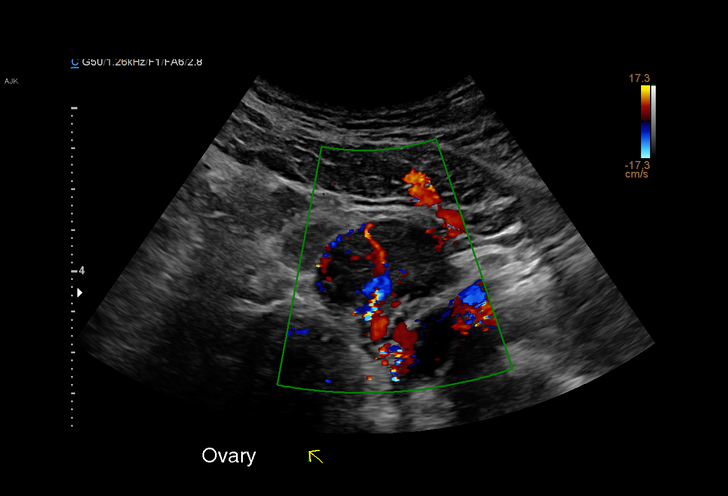

[15 of 28 positions shown; findings below may reference images not displayed]

FINDINGS: Intrauterine gestational sac: None

Yolk sac:  Not Visualized.

Embryo:  Not Visualized.

Subchorionic hemorrhage:  None visualized.

Maternal uterus/adnexae: Corpus luteum cyst seen within the left
ovary. The right ovary is normal in appearance.
IMPRESSION: No intrauterine pregnancy seen. Given the history of a positive
pregnancy test, differential considerations include intrauterine
gestation too early to visualize, completed abortion, or
non-visualized ectopic pregnancy. Close clinical correlation is
recommended with serial beta-hCG and followup ultrasound as
warranted.
